# Patient Record
Sex: Male | Born: 1990 | Race: White | Hispanic: No | Marital: Married | Smoking: Never smoker
Health system: Southern US, Community
[De-identification: ages and names within clinical notes are randomized; demographics above are authoritative.]

## PROBLEM LIST (undated history)

## (undated) DIAGNOSIS — G43909 Migraine, unspecified, not intractable, without status migrainosus: Secondary | ICD-10-CM

## (undated) DIAGNOSIS — F419 Anxiety disorder, unspecified: Secondary | ICD-10-CM

## (undated) HISTORY — PX: WISDOM TOOTH EXTRACTION: SHX21

## (undated) HISTORY — DX: Anxiety disorder, unspecified: F41.9

## (undated) HISTORY — DX: Migraine, unspecified, not intractable, without status migrainosus: G43.909

---
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
## 2008-03-06 ENCOUNTER — Emergency Department (HOSPITAL_COMMUNITY): Admission: EM | Admit: 2008-03-06 | Discharge: 2008-03-06 | Payer: Self-pay | Admitting: Emergency Medicine

## 2008-09-26 ENCOUNTER — Emergency Department (HOSPITAL_COMMUNITY): Admission: EM | Admit: 2008-09-26 | Discharge: 2008-09-26 | Payer: Self-pay | Admitting: Emergency Medicine

## 2011-09-23 LAB — STREP A DNA PROBE: Group A Strep Probe: NEGATIVE

## 2011-09-23 LAB — BASIC METABOLIC PANEL
BUN: 17
CO2: 30
Calcium: 9.3
Chloride: 103
Creatinine, Ser: 1.08
GFR calc Af Amer: >60
GFR calc non Af Amer: >60
Glucose, Bld: 112 — ABNORMAL HIGH
Potassium: 4.2
Sodium: 138

## 2011-09-23 LAB — CBC
HCT: 47.6
Hemoglobin: 16.6
MCHC: 34.9
MCV: 89.3
Platelets: 208
RBC: 5.33
RDW: 13.6
WBC: 7.1

## 2011-09-23 LAB — DIFFERENTIAL
Basophils Absolute: 0
Eosinophils Relative: 1
Lymphocytes Relative: 12
Monocytes Absolute: 0.8

## 2011-09-23 LAB — RAPID STREP SCREEN (MED CTR MEBANE ONLY): Streptococcus, Group A Screen (Direct): NEGATIVE

## 2016-01-13 ENCOUNTER — Emergency Department (HOSPITAL_COMMUNITY)
Admission: EM | Admit: 2016-01-13 | Discharge: 2016-01-14 | Disposition: A | Discharge status: Home / Self Care | Payer: Self-pay | Attending: Emergency Medicine | Admitting: Emergency Medicine

## 2016-01-13 ENCOUNTER — Encounter (HOSPITAL_COMMUNITY): Payer: Self-pay | Admitting: *Deleted

## 2016-01-13 DIAGNOSIS — S61212A Laceration without foreign body of right middle finger without damage to nail, initial encounter: Secondary | ICD-10-CM | POA: Insufficient documentation

## 2016-01-13 DIAGNOSIS — J069 Acute upper respiratory infection, unspecified: Secondary | ICD-10-CM | POA: Insufficient documentation

## 2016-01-13 DIAGNOSIS — Y998 Other external cause status: Secondary | ICD-10-CM | POA: Insufficient documentation

## 2016-01-13 DIAGNOSIS — Y9389 Activity, other specified: Secondary | ICD-10-CM | POA: Insufficient documentation

## 2016-01-13 DIAGNOSIS — Y9289 Other specified places as the place of occurrence of the external cause: Secondary | ICD-10-CM | POA: Insufficient documentation

## 2016-01-13 DIAGNOSIS — S61219A Laceration without foreign body of unspecified finger without damage to nail, initial encounter: Secondary | ICD-10-CM

## 2016-01-13 DIAGNOSIS — Z880 Allergy status to penicillin: Secondary | ICD-10-CM | POA: Insufficient documentation

## 2016-01-13 DIAGNOSIS — W268XXA Contact with other sharp object(s), not elsewhere classified, initial encounter: Secondary | ICD-10-CM | POA: Insufficient documentation

## 2016-01-13 DIAGNOSIS — J40 Bronchitis, not specified as acute or chronic: Secondary | ICD-10-CM | POA: Insufficient documentation

## 2016-01-13 MED ORDER — HYDROCOD POLST-CPM POLST ER 10-8 MG/5ML PO SUER
5.0000 mL | Freq: Once | ORAL | Status: AC
  Administered 2016-01-14: 5 mL via ORAL
  Filled 2016-01-13: qty 5

## 2016-01-13 MED ORDER — LIDOCAINE HCL (PF) 1 % IJ SOLN
5.0000 mL | Freq: Once | INTRAMUSCULAR | Status: AC
  Administered 2016-01-14: 5 mL
  Filled 2016-01-13: qty 5

## 2016-01-13 NOTE — ED Notes (Addendum)
Pt c/o headache, congestion, itchy, watery eyes and sore throat. Pt states he felt dizzy earlier today while he was changing the blade on a scraper and it cut the top of his right middle finger. Pt states the laceration has been bleeding ever since.

## 2016-01-13 NOTE — ED Provider Notes (Signed)
CSN: [id number]     Arrival date & time 01/13/16  2251 History   First MD Initiated Contact with Patient 01/13/16 2323     Chief Complaint  Patient presents with  . finger laceration    . Headache     (Consider location/radiation/quality/duration/timing/severity/associated sxs/prior Treatment) HPI Comments: Pt reports cutting the right middle finger with a scraper blade. Last tetanus was 2 years ago.  Patient is a 25 y.o. male presenting with URI. The history is provided by the patient.  URI Presenting symptoms: congestion, cough, rhinorrhea and sore throat   Presenting symptoms: no fever   Presenting symptoms comment:  Sneezing Severity:  Moderate Onset quality:  Gradual Duration:  1 week Timing:  Intermittent Progression:  Worsening Chronicity:  New Relieved by:  Nothing Associated symptoms: headaches   Associated symptoms: no myalgias and no wheezing   Risk factors: sick contacts   Risk factors: no diabetes mellitus, no immunosuppression and no recent travel     History reviewed. No pertinent past medical history. History reviewed. No pertinent past surgical history. History reviewed. No pertinent family history. Social History  Substance Use Topics  . Smoking status: Never Smoker   . Smokeless tobacco: None  . Alcohol Use: No    Review of Systems  Constitutional: Negative for fever.  HENT: Positive for congestion, rhinorrhea and sore throat.   Respiratory: Positive for cough. Negative for wheezing.   Musculoskeletal: Negative for myalgias.  Neurological: Positive for headaches.  All other systems reviewed and are negative.     Allergies  Penicillins  Home Medications   Prior to Admission medications   Not on File   BP 131/73 mmHg  Pulse 70  Temp(Src) 98.6 F (37 C) (Oral)  Resp 16  Ht 6\' 1"  (1.854 m)  Wt 81.647 kg  BMI 23.75 kg/m2  SpO2 98% Physical Exam  Constitutional: He is oriented to person, place, and time. He appears well-developed and  well-nourished.  Non-toxic appearance.  HENT:  Head: Normocephalic.  Right Ear: Tympanic membrane and external ear normal.  Left Ear: Tympanic membrane and external ear normal.  Nasal congestion present.  Eyes: EOM and lids are normal. Pupils are equal, round, and reactive to light.  Neck: Normal range of motion. Neck supple. Carotid bruit is not present.  Cardiovascular: Normal rate, regular rhythm, normal heart sounds, intact distal pulses and normal pulses.   Pulmonary/Chest: Breath sounds normal. No respiratory distress.  course breath sounds. Symmetrical rise and fall of the chest.  Abdominal: Soft. Bowel sounds are normal. There is no tenderness. There is no guarding.  Musculoskeletal: Normal range of motion.  Lymphadenopathy:       Head (right side): No submandibular adenopathy present.       Head (left side): No submandibular adenopathy present.    He has no cervical adenopathy.  Neurological: He is alert and oriented to person, place, and time. He has normal strength. No cranial nerve deficit or sensory deficit.  Skin: Skin is warm and dry.  Psychiatric: He has a normal mood and affect. His speech is normal.  Nursing note and vitals reviewed.   ED Course  .Marland Kitchen.Laceration Repair Date/Time: 01/14/2016 1:21 AM Performed by: Ivery QualeBRYANT, Gagan Dillion Authorized by: Ivery QualeBRYANT, Dacian Orrico Consent: Verbal consent obtained. Risks and benefits: risks, benefits and alternatives were discussed Consent given by: patient Patient understanding: patient states understanding of the procedure being performed Patient identity confirmed: arm band Time out: Immediately prior to procedure a "time out" was called to verify the correct patient,  procedure, equipment, support staff and site/side marked as required. Body area: upper extremity Location details: right long finger Laceration length: 1.2 cm Foreign bodies: no foreign bodies Tendon involvement: none Anesthesia: local infiltration Local anesthetic:  lidocaine 1% without epinephrine Patient sedated: no Preparation: Patient was prepped and draped in the usual sterile fashion. Irrigation solution: saline Amount of cleaning: standard Skin closure: 4-0 Prolene Number of sutures: 3 Technique: simple Approximation: close Approximation difficulty: simple Dressing: gauze roll Patient tolerance: Patient tolerated the procedure well with no immediate complications   (including critical care time) Labs Review Labs Reviewed - No data to display  Imaging Review No results found. I have personally reviewed and evaluated these images and lab results as part of my medical decision-making.   EKG Interpretation None      MDM  Vital signs stable. Chest xray is negative for pneumonia. Exam favors URI and bronchitis. Pt has a laceration of the right middle finger. This was repaired with 3 sutures of 4-0 prolene. Pt to have sutures removed in 7 days. Rx for decadron and hycodan given to the patient. Pt to return sooner if any changes or problem.   Final diagnoses:  None    *I have reviewed nursing notes, vital signs, and all appropriate lab and imaging results for this patient.Ivery Quale, PA-C 01/14/16 1610  Devoria Albe, MD 01/14/16 [phone]

## 2016-01-14 ENCOUNTER — Emergency Department (HOSPITAL_COMMUNITY): Payer: Self-pay

## 2016-01-14 MED ORDER — DEXAMETHASONE 4 MG PO TABS: 4.0000 mg | ORAL_TABLET | Freq: Two times a day (BID) | ORAL | Status: AC

## 2016-01-14 MED ORDER — PROMETHAZINE HCL 12.5 MG PO TABS
12.5000 mg | ORAL_TABLET | Freq: Once | ORAL | Status: AC
  Administered 2016-01-14: 12.5 mg via ORAL
  Filled 2016-01-14: qty 1

## 2016-01-14 MED ORDER — HYDROCODONE-HOMATROPINE 5-1.5 MG/5ML PO SYRP: 5.0000 mL | ORAL_SOLUTION | Freq: Four times a day (QID) | ORAL | Status: AC | PRN

## 2016-01-14 MED ORDER — PREDNISONE 50 MG PO TABS
60.0000 mg | ORAL_TABLET | Freq: Once | ORAL | Status: AC
  Administered 2016-01-14: 60 mg via ORAL
  Filled 2016-01-14: qty 1

## 2016-01-14 NOTE — Discharge Instructions (Signed)
Your x-ray is negative for pneumonia. Your examination favors bronchitis and upper respiratory infection. Please increase fluids. Wash hands frequently. Usual mask until symptoms have resolved. Use Decadron 2 times daily with food. Use hycodan for cough. This medication may cause drowsiness, use with caution.  Your laceration was repaired with 3 sutures. Please have these removed in 7-10 days. Please see your primary physician, or return to the emergency department if any signs of infection. Upper Respiratory Infection, Adult Most upper respiratory infections (URIs) are caused by a virus. A URI affects the nose, throat, and upper air passages. The most common type of URI is often called "the common cold." HOME CARE   Take medicines only as told by your doctor.  Gargle warm saltwater or take cough drops to comfort your throat as told by your doctor.  Use a warm mist humidifier or inhale steam from a shower to increase air moisture. This may make it easier to breathe.  Drink enough fluid to keep your pee (urine) clear or pale yellow.  Eat soups and other clear broths.  Have a healthy diet.  Rest as needed.  Go back to work when your fever is gone or your doctor says it is okay.  You may need to stay home longer to avoid giving your URI to others.  You can also wear a face mask and wash your hands often to prevent spread of the virus.  Use your inhaler more if you have asthma.  Do not use any tobacco products, including cigarettes, chewing tobacco, or electronic cigarettes. If you need help quitting, ask your doctor. GET HELP IF:  You are getting worse, not better.  Your symptoms are not helped by medicine.  You have chills.  You are getting more short of breath.  You have brown or red mucus.  You have yellow or brown discharge from your nose.  You have pain in your face, especially when you bend forward.  You have a fever.  You have puffy (swollen) neck glands.  You have  pain while swallowing.  You have white areas in the back of your throat. GET HELP RIGHT AWAY IF:   You have very bad or constant:  Headache.  Ear pain.  Pain in your forehead, behind your eyes, and over your cheekbones (sinus pain).  Chest pain.  You have long-lasting (chronic) lung disease and any of the following:  Wheezing.  Long-lasting cough.  Coughing up blood.  A change in your usual mucus.  You have a stiff neck.  You have changes in your:  Vision.  Hearing.  Thinking.  Mood. MAKE SURE YOU:   Understand these instructions.  Will watch your condition.  Will get help right away if you are not doing well or get worse.   This information is not intended to replace advice given to you by your health care provider. Make sure you discuss any questions you have with your health care provider.   Document Released: 06/03/2008 Document Revised: 05/02/2015 Document Reviewed: 03/23/2014 Elsevier Interactive Patient Education 2016 [employer]  Stitches, [place]ples, or Adhesive Wound Closure Doctors use stitches (sutures), staples, and certain glue (skin adhesives) to hold your skin together while it heals (wound closure). You may need this treatment after you have surgery or if you cut your skin accidentally. These methods help your skin heal more quickly. They also make it less likely that you will have a scar. WHAT ARE THE DIFFERENT KINDS OF WOUND CLOSURES? There are many options for  wound closure. The one that your doctor uses depends on how deep and large your wound is. Adhesive Glue To use this glue to close a wound, your doctor holds the edges of the wound together and paints the glue on the surface of your skin. You may need more than one layer of glue. Then the wound may be covered with a light bandage (dressing). This type of skin closure may be used for small wounds that are not deep (superficial). Using glue for wound closure is less painful than other  methods. It does not require a medicine that numbs the area. This method also leaves nothing to be removed. Adhesive glue is often used for children and on facial wounds. Adhesive glue cannot be used for wounds that are deep, uneven, or bleeding. It is not used inside of a wound.  Adhesive Strips These strips are made of sticky (adhesive), porous paper. They are placed across your skin edges like a regular adhesive bandage. You leave them on until they fall off. Adhesive strips may be used to close very superficial wounds. They may also be used along with sutures to improve closure of your skin edges.  Sutures Sutures are the oldest method of wound closure. Sutures can be made from natural or synthetic materials. They can be made from a material that your body can break down as your wound heals (absorbable), or they can be made from a material that needs to be removed from your skin (nonabsorbable). They come in many different strengths and sizes. Your doctor attaches the sutures to a steel needle on one end. Sutures can be passed through your skin, or through the tissues beneath your skin. Then they are tied and cut. Your skin edges may be closed in one continuous stitch or in separate stitches. Sutures are strong and can be used for all kinds of wounds. Absorbable sutures may be used to close tissues under the skin. The disadvantage of sutures is that they may cause skin reactions that lead to infection. Nonabsorbable sutures need to be removed. Staples When surgical staples are used to close a wound, the edges of your skin on both sides of the wound are brought close together. A staple is placed across the wound, and an instrument secures the edges together. Staples are often used to close surgical cuts (incisions). Staples are faster to use than sutures, and they cause less reaction from your skin. Staples need to be removed using a tool that bends the staples away from your skin. HOW DO I CARE FOR  MY WOUND CLOSURE?  Take medicines only as told by your doctor.  If you were prescribed an antibiotic medicine for your wound, finish it all even if you start to feel better.  Use ointments or creams only as told by your doctor.  Wash your hands with soap and water before and after touching your wound.  Do not soak your wound in water. Do not take baths, swim, or use a hot tub until your doctor says it is okay.  Ask your doctor when you can start showering. Cover your wound if told by your doctor.  Do not take out your own sutures or staples.  Do not pick at your wound. Picking can cause an infection.  Keep all follow-up visits as told by your doctor. This is important. HOW LONG WILL I HAVE MY WOUND CLOSURE?   Leave adhesive glue on your skin until the glue peels away.  Leave adhesive strips on your  skin until they fall off.  Absorbable sutures will dissolve within several days.  Nonabsorbable sutures and staples must be removed. The location of the wound will determine how long they stay in. This can range from several days to a couple of weeks. WHEN SHOULD I SEEK HELP FOR MY WOUND CLOSURE? Contact your doctor if:  You have a fever.  You have chills.  You have redness, puffiness (swelling), or pain at the site of your wound.  You have fluid, blood, or pus coming from your wound.  There is a bad smell coming from your wound.  The skin edges of your wound start to separate after your sutures have been removed.  Your wound becomes thick, raised, and darker in color after your sutures come out (scarring).   This information is not intended to replace advice given to you by your health care provider. Make sure you discuss any questions you have with your health care provider.   Document Released: 10/13/2009 Document Revised: 01/06/2015 Document Reviewed: 05/25/2014 Elsevier Interactive Patient Education [employer]

## 2017-07-26 IMAGING — DX DG CHEST 2V
2 series · 2 of 2 positions shown · non-contrast
Comparison: None.

CLINICAL DATA: Acute onset of productive cough and congestion.
Headache, sore throat and itchy and watery eyes. Dizziness. Initial
encounter.

EXAM:
CHEST  2 VIEW

[chest pa]
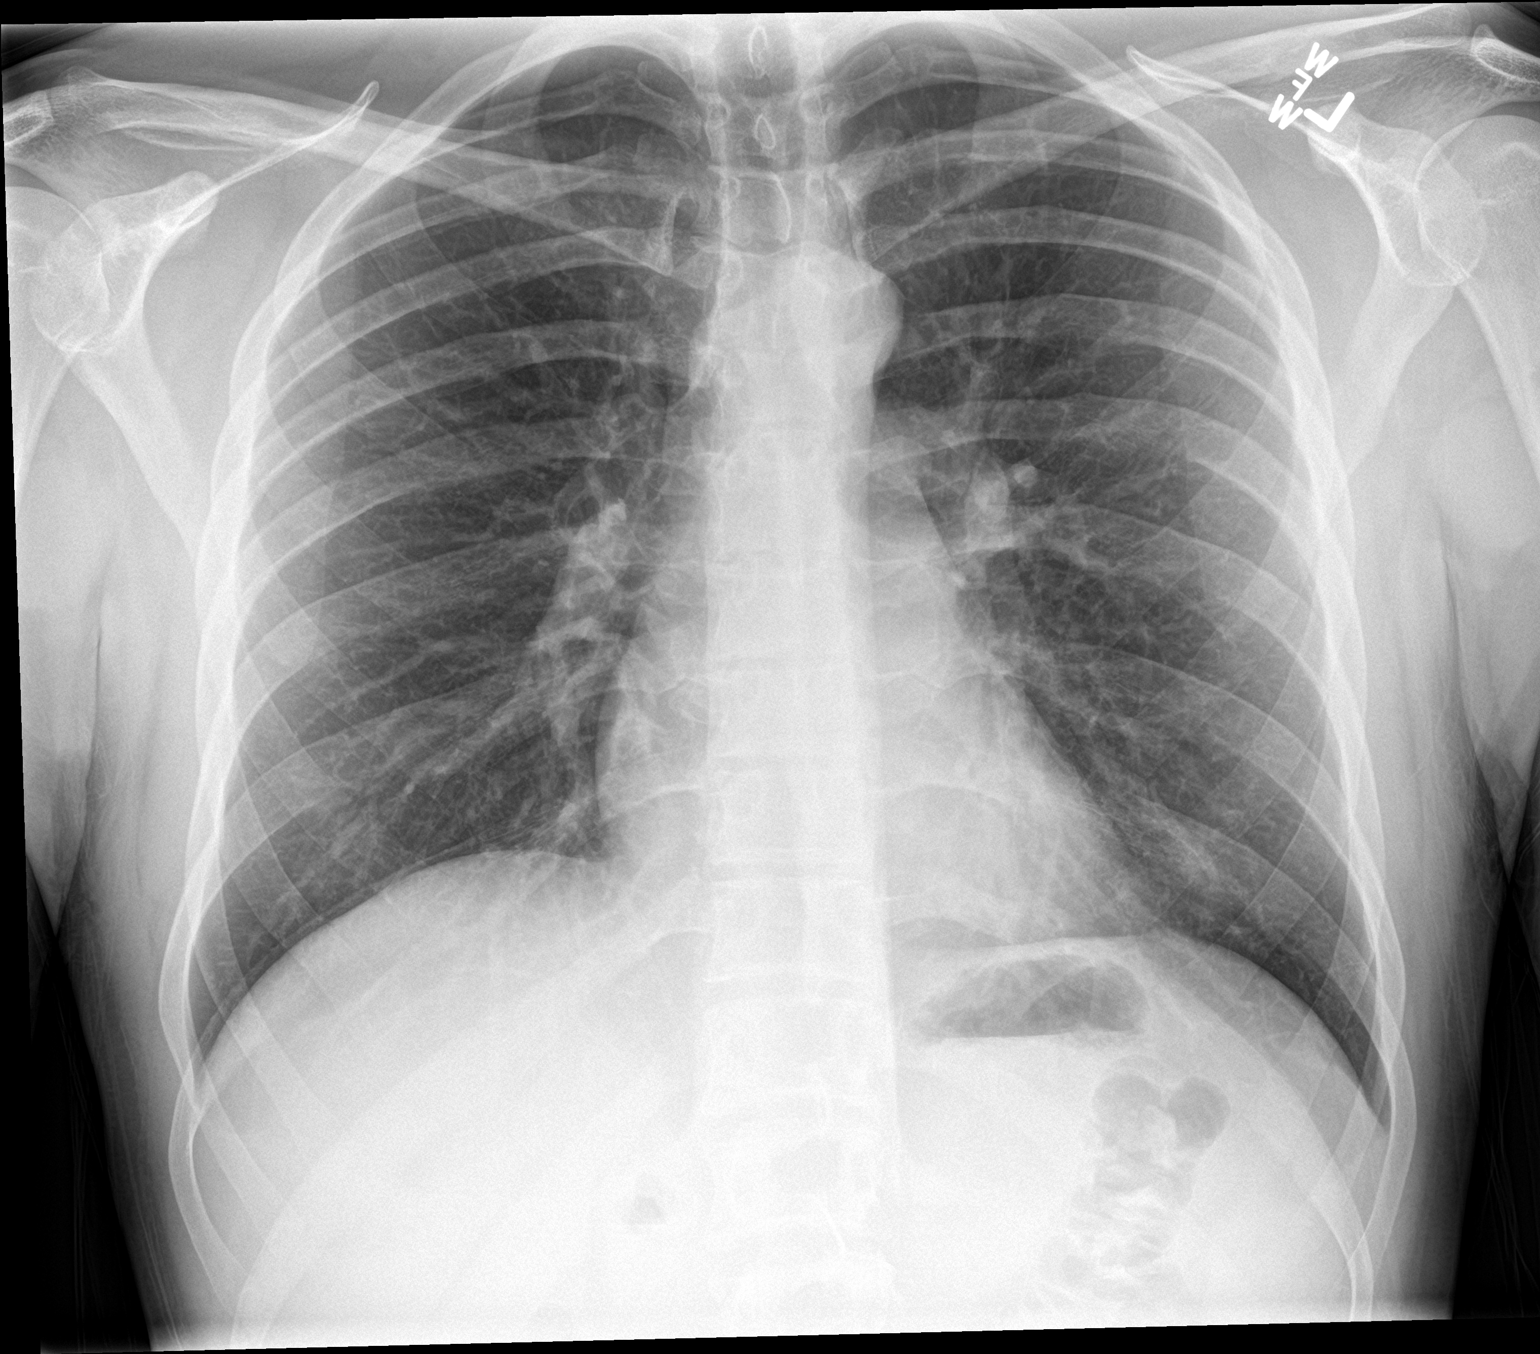

[chest lat]
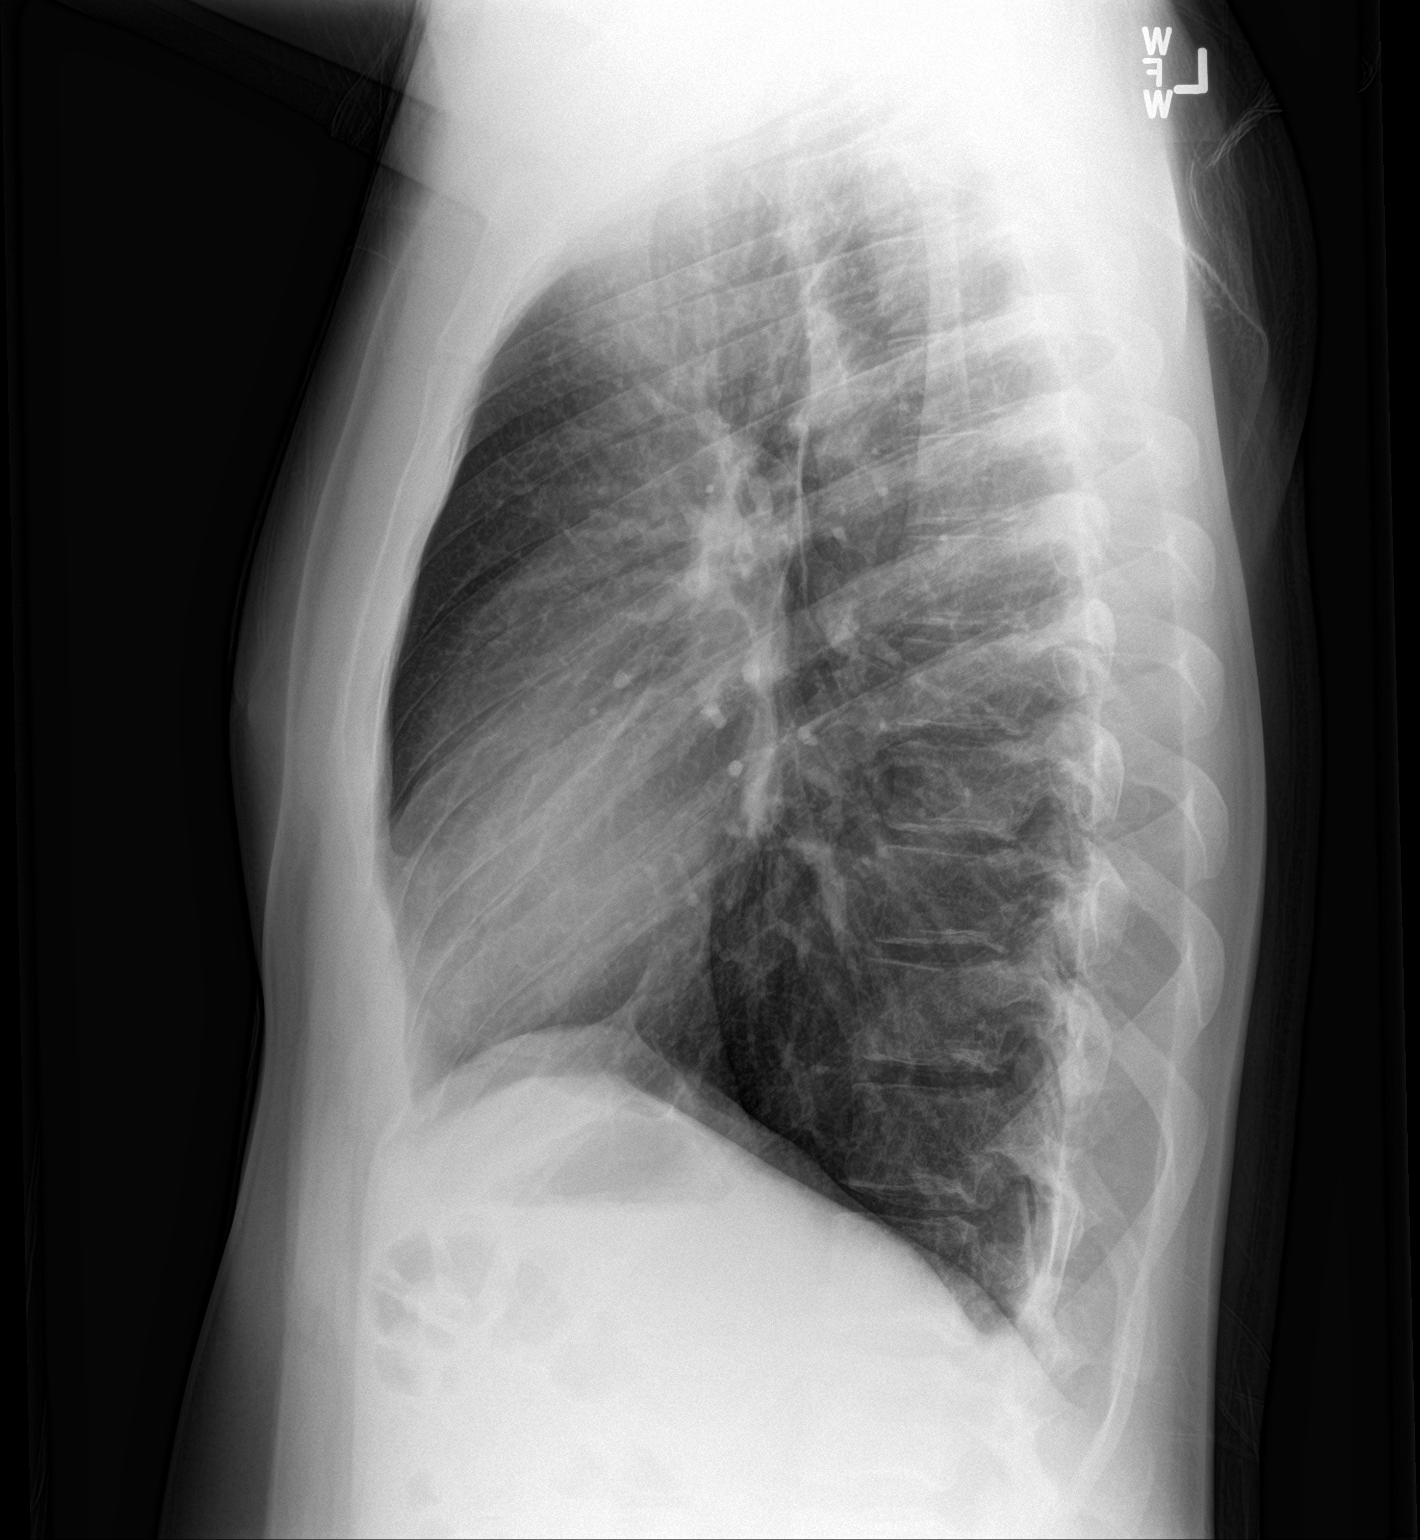

[2 of 2 positions shown; findings below may reference images not displayed]

FINDINGS: The lungs are well-aerated. Minimal left basilar atelectasis is
noted. There is no evidence of pleural effusion or pneumothorax.

The heart is normal in size; the mediastinal contour is within
normal limits. No acute osseous abnormalities are seen.
IMPRESSION: Minimal left basilar atelectasis noted.  Lungs otherwise clear.

## 2020-10-02 ENCOUNTER — Encounter: Payer: Self-pay | Admitting: Neurology

## 2021-01-01 ENCOUNTER — Ambulatory Visit (INDEPENDENT_AMBULATORY_CARE_PROVIDER_SITE_OTHER): Payer: Non-veteran care | Admitting: Neurology

## 2021-01-01 ENCOUNTER — Other Ambulatory Visit: Payer: Self-pay

## 2021-01-01 ENCOUNTER — Encounter: Payer: Self-pay | Admitting: Neurology

## 2021-01-01 VITALS — BP 119/72 | HR 77 | Ht 72.0 in | Wt 193.0 lb

## 2021-01-01 DIAGNOSIS — R202 Paresthesia of skin: Secondary | ICD-10-CM | POA: Diagnosis not present

## 2021-01-01 NOTE — Progress Notes (Signed)
[facility] Clinic Note - Initial Visit   Date: 01/01/21  Ronald Hoover MRN: [id number] DOB: 10-25-1991   Dear Dr. Lucianne Muss:  Thank you for your kind referral of Ronald Hoover for consultation of hand stiffness. Although his history is well known to you, please allow [place] to reiterate it for the purpose of our medical record. The patient was accompanied to the clinic by wife who also provides collateral information.     History of Present Illness: Ronald Hoover is a 30 y.o. right-handed male with anxiety/panic attacks and migraines presenting for evaluation of hand stiffness.  For the past two years, he reports having difficulty with relaxing his grip when holding on to objects.  Symptoms were worse when he was working in [employer] and he was working with tools and was unable to release his grip as easily.  It would take sometime for him to stretch his hands.  Since he stopped working, symptoms occur much less frequently.  He denies cramps, difficulty swallowing/talking. Sometimes, he had pain and numbness/tingling in the arms.  He also reports his right leg can fall asleep, if he's been sitting too long.   In 04-29-2019, he was sent for NCS and had syncopal event with nerve stimulation, so it was not completed.  Of note, bilateral medial and ulnar nerve responses were normal. The following day, he developed chest pain and was sent to the ER where he was told he had a panic attack.  In the fall of 04-28-2020, he stopped his paxil and a mood stabilizer, and he feels that his memory and overall health is doing better.  His hand symptoms are not as severe. He is referred for my opinion on whether symptoms could be myotonic dystrophy.  He has no family history of myotonic dystrophy.  He does have family history of hypertrophic cardiomyopathy and sudden death.  Patient had echo in 04/29/19 which was normal, EF 65-70%.   Past Medical History:  Diagnosis Date  . Anxiety   .  Migraine     History reviewed. No pertinent surgical history.   Medications:  Outpatient Encounter Medications as of 01/01/2021  Medication Sig  . Melatonin 3 MG CAPS Take 3 mg by mouth. Take one tablet at bedtime for sleep  . propranolol (INDERAL) 10 MG tablet Take 10 mg by mouth 3 (three) times daily. Take one tablet by mouth twice daily for anxiety  . dexamethasone (DECADRON) 4 MG tablet Take 1 tablet (4 mg total) by mouth 2 (two) times daily with a meal. (Patient not taking: Reported on 01/01/2021)  . HYDROcodone-homatropine (HYCODAN) 5-1.5 MG/5ML syrup Take 5 mLs by mouth every 6 (six) hours as needed. (Patient not taking: Reported on 01/01/2021)   No facility-administered encounter medications on file as of 01/01/2021.    Allergies:  Allergies  Allergen Reactions  . Penicillins    Social History: Social History   Tobacco Use  . Smoking status: Never Smoker  . Smokeless tobacco: Never Used  Vaping Use  . Vaping Use: Never used  Substance Use Topics  . Alcohol use: No  . Drug use: No   Social History   Social History Narrative   Right Handed   Lives in a one story home    Vital Signs:  BP 119/72   Pulse 77   Ht 6' (1.829 m)   Wt 193 lb (87.5 kg)   SpO2 96%   BMI 26.18 kg/m    General Medical Exam:  General:  Well appearing, comfortable.  No atypical facies. Eyes/ENT: see cranial nerve examination.   Neck:   No carotid bruits. Respiratory:  Clear to auscultation, good air entry bilaterally.   Cardiac:  Regular rate and rhythm, no murmur.   Extremities:  No deformities, edema, or skin discoloration.  Skin:  No rashes or lesions.  Neurological Exam: MENTAL STATUS including orientation to time, place, person, recent and remote memory, attention span and concentration, language, and fund of knowledge is normal.  Speech is not dysarthric.  CRANIAL NERVES: II:  No visual field defects.  Unremarkable fundi.   III-IV-VI: Pupils equal round and reactive to light.   Normal conjugate, extra-ocular eye movements in all directions of gaze.  No nystagmus.  No ptosis.   V:  Normal facial sensation.    VII:  Normal facial symmetry and movements.  Facial muscles are 5/5.  No eyelid myotonia VIII:  Normal hearing and vestibular function.   IX-X:  Normal palatal movement.   XI:  Normal shoulder shrug and head rotation.   XII:  Normal tongue strength and range of motion, no deviation or fasciculation.  MOTOR:  No atrophy, fasciculations or abnormal movements.  No pronator drift. No grip or percussion myotonia.  Upper Extremity:  Right  Left  Deltoid  5/5   5/5   Biceps  5/5   5/5   Triceps  5/5   5/5   Infraspinatus 5/5  5/5  Medial pectoralis 5/5  5/5  Wrist extensors  5/5   5/5   Wrist flexors  5/5   5/5   Finger extensors  5/5   5/5   Finger flexors  5/5   5/5   Dorsal interossei  5/5   5/5   Abductor pollicis  5/5   5/5   Tone (Ashworth scale)  0  0   Lower Extremity:  Right  Left  Hip flexors  5/5   5/5   Hip extensors  5/5   5/5   Adductor 5/5  5/5  Abductor 5/5  5/5  Knee flexors  5/5   5/5   Knee extensors  5/5   5/5   Dorsiflexors  5/5   5/5   Plantarflexors  5/5   5/5   Toe extensors  5/5   5/5   Toe flexors  5/5   5/5   Tone (Ashworth scale)  0  0   MSRs:  Right        Left                  brachioradialis 2+  2+  biceps 2+  2+  triceps 2+  2+  patellar 2+  2+  ankle jerk 2+  2+  Hoffman no  no  plantar response down  down   SENSORY:  Normal and symmetric perception of light touch, pinprick, vibration, and proprioception.  Romberg's sign absent.   COORDINATION/GAIT: Normal finger-to- nose-finger.  Intact rapid alternating movements bilaterally.  Able to rise from a chair without using arms.  Gait narrow based and stable. Tandem and stressed gait intact.    IMPRESSION: Bilateral arm dysesthesias and difficulty with releasing grip (subjective).  No signs of mytonia on exam.  In fact, his exam does not disclose any  neuromuscular abnormalities.  I discussed repeat NCS with EMG which would help determine if additional genetic testing is indicated.  My overall suspicion for myotonic dystrophy or other muscular dystrophy is very low. He expresses interest in repeat testing. I  will be cautious with testing given his prior syncopal event.   Total time spent reviewing records, interview, history/exam, documentation, and coordination of care on day of encounter:  45 min   Thank you for allowing me to participate in patient's care.  If I can answer any additional questions, I would be pleased to do so.    Sincerely,    Elfida Shimada K. Allena Katz, DO

## 2021-01-01 NOTE — Patient Instructions (Addendum)
Nerve testing of the arms ° °ELECTROMYOGRAM AND NERVE CONDUCTION STUDIES (EMG/NCS) INSTRUCTIONS ° °How to Prepare °The neurologist conducting the EMG will need to know if you have certain medical conditions. Tell the neurologist and other EMG lab personnel if you: °Have a pacemaker or any other electrical medical device °Take blood-thinning medications °Have hemophilia, a blood-clotting disorder that causes prolonged bleeding °Bathing °Take a shower or bath shortly before your exam in order to remove oils from your skin. Don’t apply lotions or creams before the exam.  °What to Expect °You’ll likely be asked to change into a hospital gown for the procedure and lie down on an examination table. The following explanations can help you understand what will happen during the exam.  °Electrodes. The neurologist or a technician places surface electrodes at various locations on your skin depending on where you’re experiencing symptoms. Or the neurologist may insert needle electrodes at different sites depending on your symptoms.  °Sensations. The electrodes will at times transmit a tiny electrical current that you may feel as a twinge or spasm. The needle electrode may cause discomfort or pain that usually ends shortly after the needle is removed. °If you are concerned about discomfort or pain, you may want to talk to the neurologist about taking a short break during the exam.  °Instructions. During the needle EMG, the neurologist will assess whether there is any spontaneous electrical activity when the muscle is at rest - activity that isn’t present in healthy muscle tissue - and the degree of activity when you slightly contract the muscle.  °He or she will give you instructions on resting and contracting a muscle at appropriate times. Depending on what muscles and nerves the neurologist is examining, he or she may ask you to change positions during the exam.  °After your EMG °You may experience some temporary, minor  bruising where the needle electrode was inserted into your muscle. This bruising should fade within several days. If it persists, contact your primary care doctor.  ° °

## 2021-02-07 ENCOUNTER — Ambulatory Visit (INDEPENDENT_AMBULATORY_CARE_PROVIDER_SITE_OTHER): Payer: Non-veteran care | Admitting: Neurology

## 2021-02-07 ENCOUNTER — Other Ambulatory Visit: Payer: Self-pay

## 2021-02-07 DIAGNOSIS — R202 Paresthesia of skin: Secondary | ICD-10-CM | POA: Diagnosis not present

## 2021-02-07 DIAGNOSIS — G5622 Lesion of ulnar nerve, left upper limb: Secondary | ICD-10-CM

## 2021-02-07 NOTE — Procedures (Signed)
[facility]  [address], [address]  [place], [place] [address] Tel: [phone] Fax:  [phone] Test Date:  02/07/2021  Patient: Ronald Hoover DOB: July 22, 1991 Physician: Nita Sickle, DO  Sex: Male Height: 6' " Ref Phys: Nita Sickle, DO  ID#: [id number]   Technician:    Patient Complaints: This is a 30 year old man referred for evaluation of bilateral hand stiffness and tingling.  NCV & EMG Findings: Extensive electrodiagnostic testing of the right upper extremity and additional studies of the left shows: 1. Bilateral median, ulnar, and mixed palmar sensory responses are within normal limits 2. Bilateral median and right ulnar motor responses are within normal limits.  Left ulnar motor response shows slowed conduction velocity across the elbow (A Elbow-B Elbow, 45 m/s).   3. There is no evidence of active or chronic motor axonal loss changes affecting any of the tested muscles.  Motor unit configuration and recruitment pattern is within normal limits.  Specifically, there is no evidence of abnormal insertional activity.  Impression: 1. Left ulnar neuropathy with slowing across the elbow, demyelinating, mild. 2. There is no evidence of cervical radiculopathy, carpal tunnel syndrome, or myopathy affecting the upper extremities.   ___________________________ Nita Sickle, DO    Nerve Conduction Studies Anti Sensory Summary Table   Stim Site NR Peak (ms) Norm Peak (ms) P-T Amp (V) Norm P-T Amp  Left Median Anti Sensory (2nd Digit)  33C  Wrist    3.1 <3.4 34.5 >20  Right Median Anti Sensory (2nd Digit)  33C  Wrist    2.9 <3.4 32.6 >20  Left Ulnar Anti Sensory (5th Digit)  33C  Wrist    2.8 <3.1 30.4 >12  Right Ulnar Anti Sensory (5th Digit)  33C  Wrist    2.5 <3.1 32.5 >12   Motor Summary Table   Stim Site NR Onset (ms) Norm Onset (ms) O-P Amp (mV) Norm O-P Amp Site1 Site2 Delta-0 (ms) Dist (cm) Vel (m/s) Norm Vel (m/s)  Left Median Motor (Abd Poll  Brev)  33C  Wrist    2.7 <3.9 9.3 >6 Elbow Wrist 5.1 30.0 59 >50  Elbow    7.8  9.2         Right Median Motor (Abd Poll Brev)  33C  Wrist    2.6 <3.9 9.5 >6 Elbow Wrist 5.1 31.0 61 >50  Elbow    7.7  9.4         Left Ulnar Motor (Abd Dig Minimi)  33C  Wrist    2.3 <3.1 8.2 >7 B Elbow Wrist 4.0 26.0 65 >50  B Elbow    6.3  7.8  A Elbow B Elbow 2.2 10.0 45 >50  A Elbow    8.5  7.5         Right Ulnar Motor (Abd Dig Minimi)  33C  Wrist    2.3 <3.1 8.7 >7 B Elbow Wrist 4.0 24.0 60 >50  B Elbow    6.3  8.3  A Elbow B Elbow 1.4 10.0 71 >50  A Elbow    7.7  8.2          Comparison Summary Table   Stim Site NR Peak (ms) Norm Peak (ms) P-T Amp (V) Site1 Site2 Delta-P (ms) Norm Delta (ms)  Left Median/Ulnar Palm Comparison (Wrist - 8cm)  33C  Median Palm    1.6 <2.2 58.4 Median Palm Ulnar Palm 0.0   Ulnar Palm    1.6 <2.2 17.2  Right Median/Ulnar Palm Comparison (Wrist - 8cm)  33C  Median Palm    1.5 <2.2 59.6 Median Palm Ulnar Palm 0.0   Ulnar Palm    1.5 <2.2 19.5       EMG   Side Muscle Ins Act Fibs Psw Fasc Number Recrt Dur Dur. Amp Amp. Poly Poly. Comment  Right 1stDorInt Nml Nml Nml Nml Nml Nml Nml Nml Nml Nml Nml Nml N/A  Right PronatorTeres Nml Nml Nml Nml Nml Nml Nml Nml Nml Nml Nml Nml N/A  Right Biceps Nml Nml Nml Nml Nml Nml Nml Nml Nml Nml Nml Nml N/A  Right Triceps Nml Nml Nml Nml Nml Nml Nml Nml Nml Nml Nml Nml N/A  Right Deltoid Nml Nml Nml Nml Nml Nml Nml Nml Nml Nml Nml Nml N/A  Left 1stDorInt Nml Nml Nml Nml Nml Nml Nml Nml Nml Nml Nml Nml N/A  Left PronatorTeres Nml Nml Nml Nml Nml Nml Nml Nml Nml Nml Nml Nml N/A  Left Biceps Nml Nml Nml Nml Nml Nml Nml Nml Nml Nml Nml Nml N/A  Left Triceps Nml Nml Nml Nml Nml Nml Nml Nml Nml Nml Nml Nml N/A  Left Deltoid Nml Nml Nml Nml Nml Nml Nml Nml Nml Nml Nml Nml N/A  Left FlexCarpiUln Nml Nml Nml Nml Nml Nml Nml Nml Nml Nml Nml Nml N/A      Waveforms:

## 2021-02-07 NOTE — Progress Notes (Signed)
    Follow-up Visit   Date: 02/07/21   MOYSES PAVEY MRN: [id number] DOB: 1991/02/23   Interim History: Ronald Hoover is a 30 y.o. right-handed Caucasian male returning to the clinic for follow-up of bilateral hand stiffness and tingling.  The patient was accompanied to the clinic by self.  History of present illness: For the past two years, he reports having difficulty with relaxing his grip when holding on to objects.  Symptoms were worse when he was working in [employer] and he was working with tools and was unable to release his grip as easily.  It would take sometime for him to stretch his hands.  Since he stopped working, symptoms occur much less frequently.  He denies cramps, difficulty swallowing/talking. Sometimes, he had pain and numbness/tingling in the arms.  He also reports his right leg can fall asleep, if he's been sitting too long.   In 2020, he was sent for NCS and had syncopal event with nerve stimulation, so it was not completed.  Of note, bilateral medial and ulnar nerve responses were normal. The following day, he developed chest pain and was sent to the ER where he was told he had a panic attack.  UPDATE 02/07/2021:  He is here for follow-up and EDX.  He continues to have tingling in the hands, which often wakes him up from sleeping. He admits to sleeping with his arms flexed.  The stiffness in the hands is not as noticeable.  No weakness.   Medications:  Current Outpatient Medications on File Prior to Visit  Medication Sig Dispense Refill  . dexamethasone (DECADRON) 4 MG tablet Take 1 tablet (4 mg total) by mouth 2 (two) times daily with a meal. (Patient not taking: Reported on 01/01/2021) 12 tablet 0  . HYDROcodone-homatropine (HYCODAN) 5-1.5 MG/5ML syrup Take 5 mLs by mouth every 6 (six) hours as needed. (Patient not taking: Reported on 01/01/2021) 120 mL 0  . Melatonin 3 MG CAPS Take 3 mg by mouth. Take one tablet at bedtime for sleep    . propranolol  (INDERAL) 10 MG tablet Take 10 mg by mouth 3 (three) times daily. Take one tablet by mouth twice daily for anxiety     No current facility-administered medications on file prior to visit.    Allergies:  Allergies  Allergen Reactions  . Penicillins     Neurological Exam: MENTAL STATUS including orientation to time, place, person, recent and remote memory, attention span and concentration, language, and fund of knowledge is normal.  Speech is not dysarthric.  MOTOR:  Motor strength is 5/5 in all extremities.  No atrophy, fasciculations or abnormal movements.  No pronator drift.  Tone is normal.    COORDINATION/GAIT:   Gait narrow based and stable.   Data: NCS/EMG of the left arm 02/07/2021: 1. Left ulnar neuropathy with slowing across the elbow, demyelinating, mild. 2. There is no evidence of cervical radiculopathy, carpal tunnel syndrome, or myopathy affecting the upper extremities.   IMPRESSION/PLAN: Left ulnar neuropathy at the elbow, mild.  NCS/EMG results discussed.   - Conservative management with avoidance of nerve compression/stretching discussed  Bilateral hand stiffness  - No evidence of neuromuscular condition  Return to clinic in as needed.     Thank you for allowing me to participate in patient's care.  If I can answer any additional questions, I would be pleased to do so.    Sincerely,    Donika K. Allena Katz, DO

## 2021-10-13 ENCOUNTER — Emergency Department (HOSPITAL_COMMUNITY)
Admission: EM | Admit: 2021-10-13 | Discharge: 2021-10-13 | Disposition: A | Discharge status: Home / Self Care | Payer: No Typology Code available for payment source | Attending: Emergency Medicine | Admitting: Emergency Medicine

## 2021-10-13 ENCOUNTER — Other Ambulatory Visit: Payer: Self-pay

## 2021-10-13 ENCOUNTER — Encounter (HOSPITAL_COMMUNITY): Payer: Self-pay | Admitting: Emergency Medicine

## 2021-10-13 DIAGNOSIS — W293XXA Contact with powered garden and outdoor hand tools and machinery, initial encounter: Secondary | ICD-10-CM | POA: Insufficient documentation

## 2021-10-13 DIAGNOSIS — S91311A Laceration without foreign body, right foot, initial encounter: Secondary | ICD-10-CM | POA: Diagnosis not present

## 2021-10-13 DIAGNOSIS — S99921A Unspecified injury of right foot, initial encounter: Secondary | ICD-10-CM | POA: Diagnosis present

## 2021-10-13 MED ORDER — LIDOCAINE HCL (PF) 1 % IJ SOLN
30.0000 mL | Freq: Once | INTRAMUSCULAR | Status: AC
  Administered 2021-10-13: 30 mL
  Filled 2021-10-13: qty 30

## 2021-10-13 NOTE — ED Triage Notes (Signed)
Pt cut into left foot with chainsaw. Bleeding controlled at this time.

## 2021-10-13 NOTE — Discharge Instructions (Addendum)
WOUND CARE Please have your stitches/staples removed in 10 days  or sooner if you have concerns. You may do this at any available urgent care or at your primary care doctor's office.  Keep area clean and dry for 24 hours. Do not remove bandage, if applied.  After 24 hours, remove bandage and wash wound gently with mild soap and warm water. Reapply a new bandage after cleaning wound, if directed.  Continue daily cleansing with soap and water until stitches/staples are removed.  Do not apply any ointments or creams to the wound while stitches/staples are in place, as this may cause delayed healing.  Seek medical careif you experience any of the following signs of infection: Swelling, redness, pus drainage, streaking, fever >101.0 F  Seek care if you experience excessive bleeding that does not stop after 15-20 minutes of constant, firm pressure.   

## 2021-10-13 NOTE — ED Provider Notes (Signed)
[facility] EMERGENCY DEPARTMENT Provider Note   CSN: [id number] Arrival date & time: 10/13/21  1827     History Chief Complaint  Patient presents with   Laceration    Ronald Hoover is a 30 y.o. male.  The history is provided by the patient. No language interpreter was used.  Laceration Location:  Foot Foot laceration location:  Top of L foot Length:  2cm, 4 cm Depth:  Cutaneous Quality: jagged and straight   Bleeding: venous   Time since incident:  2 hours Injury mechanism: chainsaw. Pain details:    Quality:  Aching   Severity:  Moderate   Timing:  Constant   Progression:  Improving Foreign body present: pieces of shoe. Relieved by:  Nothing Worsened by:  Pressure Ineffective treatments:  None tried Tetanus status:  Up to date Associated symptoms: no fever, no focal weakness, no numbness, no rash, no redness, no swelling and no streaking       Past Medical History:  Diagnosis Date   Anxiety    Migraine     There are no problems to display for this patient.   History reviewed. No pertinent surgical history.     History reviewed. No pertinent family history.  Social History   Tobacco Use   Smoking status: Never   Smokeless tobacco: Never  Vaping Use   Vaping Use: Never used  Substance Use Topics   Alcohol use: No   Drug use: No    Home Medications Prior to Admission medications   Medication Sig Start Date End Date Taking? Authorizing Provider  dexamethasone (DECADRON) 4 MG tablet Take 1 tablet (4 mg total) by mouth 2 (two) times daily with a meal. Patient not taking: Reported on 01/01/2021 01/14/16   Ivery Quale, PA-C  HYDROcodone-homatropine [facility]) 5-1.5 MG/5ML syrup Take 5 mLs by mouth every 6 (six) hours as needed. Patient not taking: Reported on 01/01/2021 01/14/16   Ivery Quale, PA-C  Melatonin 3 MG CAPS Take 3 mg by mouth. Take one tablet at bedtime for sleep    [provider]  propranolol (INDERAL) 10 MG tablet Take  10 mg by mouth 3 (three) times daily. Take one tablet by mouth twice daily for anxiety    [provider]    Allergies    Penicillins  Review of Systems   Review of Systems  Constitutional:  Negative for fever.  Skin:  Positive for wound. Negative for rash.  Neurological:  Negative for dizziness, focal weakness, weakness and numbness.   Physical Exam Updated Vital Signs BP (!) 143/90 (BP Location: Right Arm)   Pulse 69   Temp 98 F (36.7 C) (Oral)   Resp 17   Ht 6' (1.829 m)   Wt 87.5 kg   SpO2 100%   BMI 26.16 kg/m   Physical Exam Vitals and nursing note reviewed.  Constitutional:      General: He is not in acute distress.    Appearance: He is well-developed. He is not diaphoretic.  HENT:     Head: Normocephalic and atraumatic.  Eyes:     General: No scleral icterus.    Conjunctiva/sclera: Conjunctivae normal.  Cardiovascular:     Rate and Rhythm: Normal rate and regular rhythm.     Heart sounds: Normal heart sounds.  Pulmonary:     Effort: Pulmonary effort is normal. No respiratory distress.     Breath sounds: Normal breath sounds.  Abdominal:     Palpations: Abdomen is soft.  Tenderness: There is no abdominal tenderness.  Musculoskeletal:     Cervical back: Normal range of motion and neck supple.     Comments: Normal strength and ROM of the toes of the left foot and ankle. Cap refill <2 seconds. NVI  Skin:    General: Skin is warm and dry.     Findings: Laceration present.     Comments: 2 lacerations on the medial L dorsum of the foot  Neurological:     Mental Status: He is alert.  Psychiatric:        Behavior: Behavior normal.    ED Results / Procedures / Treatments   Labs (all labs ordered are listed, but only abnormal results are displayed) Labs Reviewed - No data to display  EKG None  Radiology No results found.  Procedures .Marland KitchenLaceration Repair  Date/Time: 10/13/2021 9:27 PM Performed by: Arthor Captain, PA-C Authorized by:  Arthor Captain, PA-C   Consent:    Consent obtained:  Verbal   Consent given by:  Patient   Risks discussed:  Infection, need for additional repair, pain, poor cosmetic result and poor wound healing   Alternatives discussed:  No treatment and delayed treatment Universal protocol:    Procedure explained and questions answered to patient or proxy's satisfaction: yes     Relevant documents present and verified: yes     Test results available: yes     Imaging studies available: yes     Required blood products, implants, devices, and special equipment available: yes     Site/side marked: yes     Immediately prior to procedure, a time out was called: yes     Patient identity confirmed:  Verbally with patient Anesthesia:    Anesthesia method:  Local infiltration   Local anesthetic:  Lidocaine 1% w/o epi Laceration details:    Location:  Foot   Foot location:  Top of L foot   Length (cm):  2 Pre-procedure details:    Preparation:  Patient was prepped and draped in usual sterile fashion Exploration:    Wound exploration: wound explored through full range of motion     Contaminated: yes   Treatment:    Area cleansed with:  Chlorhexidine   Amount of cleaning:  Extensive   Irrigation solution:  Sterile saline   Irrigation method:  Syringe   Visualized foreign bodies/material removed: yes (multiple pieces of  cloth and rubber)     Debridement:  Moderate Skin repair:    Repair method:  Sutures   Suture size:  5-0   Suture material:  Nylon   Suture technique:  Simple interrupted   Number of sutures:  3 Approximation:    Approximation:  Close Repair type:    Repair type:  Intermediate Post-procedure details:    Dressing:  Sterile dressing   Procedure completion:  Tolerated well, no immediate complications .Marland KitchenLaceration Repair  Date/Time: 10/13/2021 9:29 PM Performed by: Arthor Captain, PA-C Authorized by: Arthor Captain, PA-C   Consent:    Consent obtained:  Verbal   Consent  given by:  Patient   Risks discussed:  Infection, need for additional repair, pain, poor cosmetic result and poor wound healing   Alternatives discussed:  No treatment and delayed treatment Universal protocol:    Procedure explained and questions answered to patient or proxy's satisfaction: yes     Relevant documents present and verified: yes     Test results available: yes     Imaging studies available: yes     Required blood  products, implants, devices, and special equipment available: yes     Site/side marked: yes     Immediately prior to procedure, a time out was called: yes     Patient identity confirmed:  Verbally with patient Anesthesia:    Anesthesia method:  Local infiltration   Local anesthetic:  Lidocaine 1% w/o epi Laceration details:    Location:  Foot   Foot location:  Top of L foot   Length (cm):  4 Pre-procedure details:    Preparation:  Patient was prepped and draped in usual sterile fashion Exploration:    Wound exploration: wound explored through full range of motion and entire depth of wound visualized     Contaminated: yes   Treatment:    Area cleansed with:  Chlorhexidine   Amount of cleaning:  Extensive   Irrigation solution:  Sterile saline   Irrigation method:  Syringe   Visualized foreign bodies/material removed: yes (multiple pieces of embedded cloth and rubber)     Debridement:  Moderate Skin repair:    Repair method:  Sutures   Suture size:  5-0   Suture material:  Nylon   Suture technique:  Running locked (6) Approximation:    Approximation:  Close Repair type:    Repair type:  Intermediate Post-procedure details:    Dressing:  Sterile dressing   Procedure completion:  Tolerated well, no immediate complications   Medications Ordered in ED Medications  lidocaine (PF) (XYLOCAINE) 1 % injection 30 mL (30 mLs Infiltration Given by Other 10/13/21 2028)    ED Course  I have reviewed the triage vital signs and the nursing notes.  Pertinent  labs & imaging results that were available during my care of the patient were reviewed by me and considered in my medical decision making (see chart for details).    MDM Rules/Calculators/A&P                           Ronald Hoover is a 30 y.o. male who presents to ED for laceration of Left foot. Wound thoroughly cleaned in ED today. Wound explored and bottom of wound seen in a bloodless field. Laceration repaired as dictated above. Patient counseled on home wound care. Follow up with PCP/urgent care or return to ER for suture removal in 10 days. Patient was urged to return to the Emergency Department for worsening pain, swelling, expanding erythema especially if it streaks away from the affected area, fever, or for any additional concerns. Patient verbalized understanding. All questions answered.  Final Clinical Impression(s) / ED Diagnoses Final diagnoses:  Laceration of right foot, initial encounter  Contact with chainsaw as cause of accidental injury    Rx / DC Orders ED Discharge Orders     None        Arthor Captain, PA-C 10/13/21 2131    Pricilla Loveless, MD 10/13/21 2350

## 2024-06-28 ENCOUNTER — Encounter: Payer: Self-pay | Admitting: Internal Medicine

## 2024-08-02 NOTE — Progress Notes (Unsigned)
 GI Office Note    Referring Provider: Dasie Perkins, PA Primary Care Physician:  System, Provider Not In  Primary Gastroenterologist: [name].Rourk, MD  Chief Complaint   No chief complaint on file.  History of Present Illness   Ronald Hoover is a 33 y.o. male presenting today at the request of [place]randt, Perkins, [place] [facility] ***[facility] and colonoscopy given inability to perform at [place] until December.   Referral paperwork states patient has IBS with diarrhea which was not only mildly improved with daily fiber to reduce incidence of accidents but states BMs are beaded versus good formed stools and avoids Imodium due to causing severe constipation.  Having multiple bowel movements per day with lower abdominal pain prior to bowel movements which improves after defecation and with antianxiety medications.  Uses Bentyl as needed.  Bowel movements are often triggered by anxious episodes and involved in activities with lots of people.  Also reported worsening reflux now daily occurring every morning where he has a sour taste in his mouth and sometimes acid spit up.  Also admits to hiccups and belching as well but not using anything.  Notes some dysphagia at dinner but mostly occurring with heightened stress.  Denies any history of food impaction or regurgitation or aspiration.  Admits to NSAIDs multiple times per week for migraines.  He was advised to continue psyllium with adequate hydration, Imodium as needed.  Requested EGD and colonoscopy for further evaluation for peptic ulcer disease and chronic diarrhea to rule out colitis and IBD.  Also advised continue Bentyl 30 minutes prior to meals if needed  Today:  Discussed the use of AI scribe software for clinical note transcription with the patient, who gave verbal consent to proceed.  Wt Readings from Last 5 Encounters:  10/13/21 192 lb 14.4 oz (87.5 kg)  01/01/21 193 lb (87.5 kg)  01/13/16 180 lb (81.6 kg)    Current Outpatient  Medications  Medication Sig Dispense Refill   dexamethasone  (DECADRON ) 4 MG tablet Take 1 tablet (4 mg total) by mouth 2 (two) times daily with a meal. (Patient not taking: Reported on 01/01/2021) 12 tablet 0   HYDROcodone -homatropine (HYCODAN) 5-1.5 MG/5ML syrup Take 5 mLs by mouth every 6 (six) hours as needed. (Patient not taking: Reported on 01/01/2021) 120 mL 0   Melatonin 3 MG CAPS Take 3 mg by mouth. Take one tablet at bedtime for sleep     propranolol (INDERAL) 10 MG tablet Take 10 mg by mouth 3 (three) times daily. Take one tablet by mouth twice daily for anxiety     No current facility-administered medications for this visit.    Past Medical History:  Diagnosis Date   Anxiety    Migraine     No past surgical history on file.  No family history on file.  Allergies as of 08/03/2024 - Review Complete 10/13/2021  Allergen Reaction Noted   Penicillins  01/13/2016    Social History   Socioeconomic History   Marital status: Married    Spouse name: Not on file   Number of children: 4   Years of education: Not on file   Highest education level: Not on file  Occupational History   Not on file  Tobacco Use   Smoking status: Never   Smokeless tobacco: Never  Vaping Use   Vaping status: Never Used  Substance and Sexual Activity   Alcohol use: No   Drug use: No   Sexual activity: Not on file  Other  Topics Concern   Not on file  Social History Narrative   Right Handed   Lives in a one story home   Social Drivers of Health   Financial Resource Strain: Not on file  Food Insecurity: Not on file  Transportation Needs: Not on file  Physical Activity: Not on file  Stress: Not on file  Social Connections: Unknown (05/14/2022)   [facility]   Social Network    Social Network: Not on file  Intimate Partner Violence: Unknown (04/04/2022)   Received from [facility]   HITS    Physically Hurt: Not on file    Insult or Talk Down To: Not on file    Threaten  Physical Harm: Not on file    Scream or Curse: Not on file     Review of Systems   Gen: Denies any fever, chills, fatigue, weight loss, lack of appetite.  CV: Denies chest pain, heart palpitations, peripheral edema, syncope.  Resp: Denies shortness of breath at rest or with exertion. Denies wheezing or cough.  GI: see HPI GU : Denies urinary burning, urinary frequency, urinary hesitancy MS: Denies joint pain, muscle weakness, cramps, or limitation of movement.  Derm: Denies rash, itching, dry skin Psych: Denies depression, anxiety, memory loss, and confusion Heme: Denies bruising, bleeding, and enlarged lymph nodes.  Physical Exam   There were no vitals taken for this visit.  General:   Alert and oriented. Pleasant and cooperative. Well-nourished and well-developed.  Head:  Normocephalic and atraumatic. Eyes:  Without icterus, sclera clear and conjunctiva pink.  Ears:  Normal auditory acuity. Mouth:  No deformity or lesions, oral mucosa pink.  Lungs:  Clear to auscultation bilaterally. No wheezes, rales, or rhonchi. No distress.  Heart:  S1, S2 present without murmurs appreciated.  Abdomen:  +BS, soft, non-tender and non-distended. No HSM noted. No guarding or rebound. No masses appreciated.  Rectal:  deferred *** Msk:  Symmetrical without gross deformities. Normal posture. Extremities:  Without edema. Neurologic:  Alert and  oriented x4;  grossly normal neurologically. Skin:  Intact without significant lesions or rashes. Psych:  Alert and cooperative. Normal mood and affect.  Assessment   Ronald Hoover is a 33 y.o. male with a history of *** presenting today with ***  IBS-D  GERD, dysphagia, hiccups   PLAN   *** Avoid NSAIDs Continue Bentyl as needed Continue psyllium EGD and colonoscopy Imodium as needed Continue omeprazole 40 mg daily, consider increasing to twice daily. Follow up ***   Charmaine Melia, MSN, FNP-BC, AGACNP-BC [facility]

## 2024-08-02 NOTE — H&P (View-Only) (Signed)
 GI Office Note    Referring Provider: Dasie Perkins, [place] Primary Care Physician:  Dasie Perkins, PA  Primary Gastroenterologist: [name].Rourk, MD  Chief Complaint   Chief Complaint  Patient presents with   Dysphagia    Feels like food is stuck in his throat. Vomits sometimes. Has IBS.    History of Present Illness   Ronald Hoover is a 33 y.o. male presenting today at the request of Ank[place]randt, Perkins, [place] [facility] given inability to perform at [facility] until December.  Referral paperwork states patient has IBS with diarrhea which was not only mildly improved with daily fiber to reduce incidence of accidents but states BMs are beaded versus good formed stools and avoids Imodium due to causing severe constipation.  Having multiple bowel movements per day with lower abdominal pain prior to bowel movements which improves after defecation and with antianxiety medications.  Uses Bentyl as needed.  Bowel movements are often triggered by anxious episodes and involved in activities with lots of people.  Also reported worsening reflux now daily occurring every morning where he has a sour taste in his mouth and sometimes acid spit up.  Also admits to hiccups and belching as well but not using anything.  Notes some dysphagia at dinner but mostly occurring with heightened stress.  Denies any history of food impaction or regurgitation or aspiration.  Admits to NSAIDs multiple times per week for migraines.  He was advised to continue psyllium with adequate hydration, Imodium as needed.  Requested EGD and colonoscopy for further evaluation for peptic ulcer disease and chronic diarrhea to rule out colitis and IBD.  Also advised continue Bentyl 30 minutes prior to meals if needed  Today:  Discussed the use of AI scribe software for clinical note transcription with the patient, who gave verbal consent to proceed.  He has been experiencing difficulty swallowing, with a sensation  of something being stuck in his throat for several months. This occurs with both solids and liquids. He frequently experiences hiccups and burps after meals, and occasionally vomits, especially when rolling over in the morning or brushing his teeth. Vomiting episodes are often preceded by a feeling of nausea, and he sometimes swallows the vomit before reaching the bathroom. The vomitus is usually just water , as he keeps water  by his bed.  His symptoms occur with most foods and drinks, with a recent episode lasting three to four hours after eating a cheeseburger. His diet mainly consists of red meats, fattier meats, and fried foods. He consumes a significant amount of caffeine, including soda, tea, and coffee. He has not yet started omeprazole, which was recently mailed to him.  He has a history of irritable bowel syndrome (IBS) and experiences frequent diarrhea, with food passing quickly and in liquid form. He has tried medications like loperamide and dicyclomine, but they caused constipation when overused. Cramping occurs mostly while eating and in public places, which he attributes to stress and anxiety. He takes Wellbutrin, buspirone, and trazodone, the latter for sleep.  Denies any melena or BRBPR.  He has a history of frequent headaches and migraines, for which he takes propranolol as a preventative and sumatriptan for acute migraines. He also uses Tylenol, ibuprofen, or Aleve several times a week for headache relief. He previously took Tylenol more frequently before starting propranolol.  He recalls a past episode of salmonella, possibly contracted from a buffet, and mentions a history of exposure to unsanitary conditions during [employer], which he  believes may have contributed to his gastrointestinal issues.       Wt Readings from Last 5 Encounters:  08/03/24 230 lb 12.8 oz (104.7 kg)  10/13/21 192 lb 14.4 oz (87.5 kg)  01/01/21 193 lb (87.5 kg)  01/13/16 180 lb (81.6 kg)    Current  Outpatient Medications  Medication Sig Dispense Refill   buPROPion (WELLBUTRIN SR) 150 MG 12 hr tablet Take 150 mg by mouth 2 (two) times daily.     busPIRone (BUSPAR) 15 MG tablet Take 15 mg by mouth 3 (three) times daily.     dicyclomine (BENTYL) 20 MG tablet Take 20 mg by mouth every 6 (six) hours.     loperamide (IMODIUM) 2 MG capsule Take by mouth as needed for diarrhea or loose stools.     omeprazole (PRILOSEC) 20 MG capsule Take 40 mg by mouth daily.     propranolol (INDERAL) 10 MG tablet Take 10 mg by mouth 3 (three) times daily. Take one tablet by mouth twice daily for anxiety     psyllium (METAMUCIL) 58.6 % packet Take 1 packet by mouth daily.     traZODone (DESYREL) 50 MG tablet Take 50 mg by mouth at bedtime.     dexamethasone  (DECADRON ) 4 MG tablet Take 1 tablet (4 mg total) by mouth 2 (two) times daily with a meal. (Patient not taking: Reported on 08/03/2024) 12 tablet 0   HYDROcodone -homatropine (HYCODAN) 5-1.5 MG/5ML syrup Take 5 mLs by mouth every 6 (six) hours as needed. (Patient not taking: Reported on 08/03/2024) 120 mL 0   Melatonin 3 MG CAPS Take 3 mg by mouth. Take one tablet at bedtime for sleep (Patient not taking: Reported on 08/03/2024)     No current facility-administered medications for this visit.    Past Medical History:  Diagnosis Date   Anxiety    Migraine     History reviewed. No pertinent surgical history.  History reviewed. No pertinent family history.  Allergies as of 08/03/2024 - Review Complete 08/03/2024  Allergen Reaction Noted   Penicillins  01/13/2016    Social History   Socioeconomic History   Marital status: Married    Spouse name: Not on file   Number of children: 4   Years of education: Not on file   Highest education level: Not on file  Occupational History   Not on file  Tobacco Use   Smoking status: Never   Smokeless tobacco: Never  Vaping Use   Vaping status: Never Used  Substance and Sexual Activity   Alcohol use: No    Drug use: No   Sexual activity: Not on file  Other Topics Concern   Not on file  Social History Narrative   Right Handed   Lives in a one story home   Social Drivers of Health   Financial Resource Strain: Not on file  Food Insecurity: Not on file  Transportation Needs: Not on file  Physical Activity: Not on file  Stress: Not on file  Social Connections: Unknown (05/14/2022)   Received from [facility]   Social Network    Social Network: Not on file  Intimate Partner Violence: Unknown (04/04/2022)   Received from [facility]   HITS    Physically Hurt: Not on file    Insult or Talk Down To: Not on file    Threaten Physical Harm: Not on file    Scream or Curse: Not on file    Review of Systems   Gen: Denies any fever, chills,  fatigue, weight loss, lack of appetite.  CV: Denies chest pain, heart palpitations, peripheral edema, syncope.  Resp: Denies shortness of breath at rest or with exertion. Denies wheezing or cough.  GI: see HPI GU : Denies urinary burning, urinary frequency, urinary hesitancy MS: Denies joint pain, muscle weakness, cramps, or limitation of movement.  Derm: Denies rash, itching, dry skin Psych: + Anxiety/depression.  Denies memory loss, and confusion Heme: Denies bruising, bleeding, and enlarged lymph nodes.  Physical Exam   BP 120/83 (BP Location: Right Arm, Patient Position: Sitting, Cuff Size: Large)   Pulse 75   Temp 97.9 F (36.6 C) (Temporal)   Ht 6' (1.829 m)   Wt 230 lb 12.8 oz (104.7 kg)   BMI 31.30 kg/m   General:   Alert and oriented. Pleasant and cooperative. Well-nourished and well-developed.  Head:  Normocephalic and atraumatic. Eyes:  Without icterus, sclera clear and conjunctiva pink.  Ears:  Normal auditory acuity. Mouth:  No deformity or lesions, oral mucosa pink.  Lungs:  Clear to auscultation bilaterally. No wheezes, rales, or rhonchi. No distress.  Heart:  S1, S2 present without murmurs appreciated.  Abdomen:  +BS, soft,  non-tender and non-distended. No HSM noted. No guarding or rebound. No masses appreciated.  Rectal:  deferred Msk:  Symmetrical without gross deformities. Normal posture. Extremities:  Without edema. Neurologic:  Alert and  oriented x4;  grossly normal neurologically. Skin:  Intact without significant lesions or rashes. Psych:  Alert and cooperative. Normal mood and affect.  Assessment   Ronald Hoover is a 33 y.o. male with a history of IBS, anxiety/depression, chronic diarrhea, GERD presenting today to schedule EGD and colonoscopy for further evaluation given no sooner availability at the [facility].     Dysphagia and gastroesophageal reflux disease with globus sensation.  Chronic dysphagia with sensation of food impaction, hiccups, and regurgitation, suggestive of GERD with potential esophageal damage. Frequent NSAID use may exacerbate symptoms. Omeprazole prescribed but not initiated. Need to rule out H. pylori infection and assess esophageal damage. Chronic acid exposure can weaken the esophageal sphincter, leading to regurgitation and swallowing difficulties. - Start omeprazole 40 mg daily. - Schedule upper endoscopy to assess esophageal damage (gastritis, esophagitis, duodenitis, peptic ulcer disease) and test for H. pylori. - Educated on risks of NSAID use and its impact on GERD. - GERD diet recommended  Chronic diarrhea and evaluation for irritable bowel syndrome versus inflammatory bowel disease Chronic diarrhea with cramping, possibly stress-induced, suggestive of IBS. Differential includes IBD such as ulcerative colitis or Crohn's disease, celiac disease, and pancreatic insufficiency.  Reassuringly does not have any melena or BRBPR.  Previous extensive blood work but no specific GI labs available for review. Need for stool and blood tests to evaluate for inflammatory markers, celiac disease, and pancreatic insufficiency. - Ordering fecal calprotectin, celiac disease panel, CRP, TSH, and  pancreatic insufficiency. - Schedule colonoscopy with biopsy to evaluate for IBD and microscopic colitis. - Advise against taking dicyclomine or Imodium 3-4 days prior to colonoscopy. - Pending results of stool testing and colonoscopy may be a good candidate for Xifaxan. - Okay with continuing Bentyl and Imodium as needed up until colonoscopy.      Proceed with upper endoscopy with dilation and colonoscopy with propofol  by Dr. Shaaron in near future: the risks, benefits, and alternatives have been discussed with the patient in detail. The patient states understanding and desires to proceed. ASA 2  Hold Imodium and dicyclomine for 3-4 days prior  Hold trazodone night prior  to procedure.   PLAN   Follow up post procedure as needed - [facility]    Charmaine Melia, MSN, FNP-BC, AGACNP-BC [facility]

## 2024-08-03 ENCOUNTER — Telehealth: Payer: Self-pay | Admitting: *Deleted

## 2024-08-03 ENCOUNTER — Ambulatory Visit (INDEPENDENT_AMBULATORY_CARE_PROVIDER_SITE_OTHER): Admitting: Gastroenterology

## 2024-08-03 ENCOUNTER — Encounter: Payer: Self-pay | Admitting: Gastroenterology

## 2024-08-03 VITALS — BP 120/83 | HR 75 | Temp 97.9°F | Ht 72.0 in | Wt 230.8 lb

## 2024-08-03 DIAGNOSIS — K219 Gastro-esophageal reflux disease without esophagitis: Secondary | ICD-10-CM

## 2024-08-03 DIAGNOSIS — R1310 Dysphagia, unspecified: Secondary | ICD-10-CM

## 2024-08-03 DIAGNOSIS — R09A2 Foreign body sensation, throat: Secondary | ICD-10-CM

## 2024-08-03 DIAGNOSIS — K529 Noninfective gastroenteritis and colitis, unspecified: Secondary | ICD-10-CM | POA: Diagnosis not present

## 2024-08-03 NOTE — Telephone Encounter (Signed)
 LMOVM to call back to schedule TCS/EGD/ED with Dr. Shaaron, ASA 2, stop imdoim and dicyclomine 4 days prior and stop trazodone night prior

## 2024-08-03 NOTE — Patient Instructions (Addendum)
 VISIT SUMMARY: During your visit, we discussed your ongoing issues with difficulty swallowing, gastrointestinal symptoms, and chronic diarrhea. We reviewed your current medications and dietary habits, and we have outlined a plan to address your symptoms and investigate potential underlying causes.  YOUR PLAN: -DYSPHAGIA AND GASTROESOPHAGEAL REFLUX DISEASE (GERD): Dysphagia means difficulty swallowing, and GERD is a condition where stomach acid frequently flows back into the tube connecting your mouth and stomach, causing irritation. You should start taking omeprazole 40 mg daily to reduce stomach acid. We will schedule an upper endoscopy to check for any damage to your esophagus and test for H. pylori infection. It's important to be cautious with NSAID use, as it can worsen GERD symptoms.  Follow a GERD diet:  Avoid fried, fatty, greasy, spicy, citrus foods. Avoid caffeine and carbonated beverages. Avoid chocolate. Try eating 4-6 small meals a day rather than 3 large meals. Do not eat within 3 hours of laying down. Prop head of bed up on wood or bricks to create a 6 inch incline.  -CHRONIC DIARRHEA AND EVALUATION FOR IRRITABLE BOWEL SYNDROME (IBS) VERSUS INFLAMMATORY BOWEL DISEASE (IBD): Chronic diarrhea can be a symptom of IBS, which is a disorder affecting the large intestine, or IBD, which includes conditions like ulcerative colitis and Crohn's disease. We will conduct stool and blood tests to check for inflammatory markers, celiac disease, and pancreatic insufficiency. A colonoscopy with biopsy will be scheduled to further investigate the cause of your symptoms. Please avoid taking dicyclomine or Imodium 3-4 days before the colonoscopy.  INSTRUCTIONS: Please start taking omeprazole 40 mg daily as prescribed. We will schedule an upper endoscopy and a colonoscopy with biopsy. Avoid taking dicyclomine or Imodium two days before the colonoscopy.   Please have blood work completed at [facility].  We  will call you with results once they have been received. Please allow 3-5 business days for review. 2 locations for [facility] in [place]:              1. [address], [place]              2. [address] [place], [place]    Contains text generated by [employer].   It was a pleasure to see you today. I want to create trusting relationships with patients. If you receive a survey regarding your visit,  I greatly appreciate you taking time to fill this out on paper or through your MyChart. I value your feedback.  Charmaine Melia, MSN, FNP-BC, AGACNP-BC [facility]

## 2024-08-06 ENCOUNTER — Ambulatory Visit: Payer: Self-pay | Admitting: Gastroenterology

## 2024-08-06 LAB — PANCREATIC ELASTASE, FECAL: Pancreatic Elastase, Fecal: >800 ug Elast./g (ref >200)

## 2024-08-06 LAB — CALPROTECTIN, FECAL: Calprotectin, Fecal: <5 ug/g (ref 0–120)

## 2024-08-08 LAB — C-REACTIVE PROTEIN: CRP: 2 mg/L (ref 0–10)

## 2024-08-08 LAB — CELIAC DISEASE PANEL
Endomysial IgA: NEGATIVE
IgA/Immunoglobulin A, Serum: 229 mg/dL (ref 90–386)
Transglutaminase IgA: <2 U/mL (ref 0–3)

## 2024-08-08 LAB — TSH+FREE T4
Free T4: 1.16 ng/dL (ref 0.82–1.77)
TSH: 1.63 u[IU]/mL (ref 0.450–4.500)

## 2024-08-10 ENCOUNTER — Encounter: Payer: Self-pay | Admitting: *Deleted

## 2024-08-10 ENCOUNTER — Other Ambulatory Visit: Payer: Self-pay | Admitting: *Deleted

## 2024-08-10 MED ORDER — PEG 3350-KCL-NA BICARB-NACL 420 G PO SOLR: 4000.0000 mL | Freq: Once | ORAL | 0 refills | Status: AC

## 2024-08-10 NOTE — Telephone Encounter (Signed)
 Pt has been scheduled for 08/26/24. Instructions mailed and prep sent to pharmacy.

## 2024-08-26 ENCOUNTER — Ambulatory Visit (HOSPITAL_COMMUNITY)
Admission: RE | Admit: 2024-08-26 | Discharge: 2024-08-26 | Disposition: A | Discharge status: Home / Self Care | Attending: Internal Medicine | Admitting: Internal Medicine

## 2024-08-26 ENCOUNTER — Encounter (HOSPITAL_COMMUNITY)
Admission: RE | Disposition: A | Discharge status: Home / Self Care | Payer: Self-pay | Source: Home / Self Care | Attending: Internal Medicine

## 2024-08-26 ENCOUNTER — Ambulatory Visit (HOSPITAL_BASED_OUTPATIENT_CLINIC_OR_DEPARTMENT_OTHER): Admitting: Certified Registered"

## 2024-08-26 ENCOUNTER — Ambulatory Visit (HOSPITAL_COMMUNITY): Admitting: Certified Registered"

## 2024-08-26 ENCOUNTER — Encounter (HOSPITAL_COMMUNITY): Payer: Self-pay | Admitting: Internal Medicine

## 2024-08-26 ENCOUNTER — Other Ambulatory Visit: Payer: Self-pay

## 2024-08-26 DIAGNOSIS — K2970 Gastritis, unspecified, without bleeding: Secondary | ICD-10-CM | POA: Insufficient documentation

## 2024-08-26 DIAGNOSIS — Z7952 Long term (current) use of systemic steroids: Secondary | ICD-10-CM | POA: Diagnosis not present

## 2024-08-26 DIAGNOSIS — K5730 Diverticulosis of large intestine without perforation or abscess without bleeding: Secondary | ICD-10-CM

## 2024-08-26 DIAGNOSIS — K2100 Gastro-esophageal reflux disease with esophagitis, without bleeding: Secondary | ICD-10-CM | POA: Insufficient documentation

## 2024-08-26 DIAGNOSIS — K2289 Other specified disease of esophagus: Secondary | ICD-10-CM | POA: Insufficient documentation

## 2024-08-26 DIAGNOSIS — I10 Essential (primary) hypertension: Secondary | ICD-10-CM | POA: Insufficient documentation

## 2024-08-26 DIAGNOSIS — R1310 Dysphagia, unspecified: Secondary | ICD-10-CM | POA: Insufficient documentation

## 2024-08-26 DIAGNOSIS — Z79899 Other long term (current) drug therapy: Secondary | ICD-10-CM | POA: Insufficient documentation

## 2024-08-26 DIAGNOSIS — K529 Noninfective gastroenteritis and colitis, unspecified: Secondary | ICD-10-CM

## 2024-08-26 DIAGNOSIS — F419 Anxiety disorder, unspecified: Secondary | ICD-10-CM | POA: Insufficient documentation

## 2024-08-26 DIAGNOSIS — G43909 Migraine, unspecified, not intractable, without status migrainosus: Secondary | ICD-10-CM | POA: Diagnosis not present

## 2024-08-26 HISTORY — PX: COLONOSCOPY: SHX5424

## 2024-08-26 HISTORY — PX: ESOPHAGEAL DILATION: SHX303

## 2024-08-26 HISTORY — PX: ESOPHAGOGASTRODUODENOSCOPY: SHX5428

## 2024-08-26 SURGERY — COLONOSCOPY
Anesthesia: General

## 2024-08-26 MED ORDER — LIDOCAINE 2% (20 MG/ML) 5 ML SYRINGE
INTRAMUSCULAR | Status: DC | PRN
  Administered 2024-08-26: 100 mg via INTRAVENOUS

## 2024-08-26 MED ORDER — LACTATED RINGERS IV SOLN
INTRAVENOUS | Status: DC | PRN
Start: 2024-08-26 — End: 2024-08-26

## 2024-08-26 MED ORDER — LACTATED RINGERS IV SOLN: INTRAVENOUS | Status: DC

## 2024-08-26 MED ORDER — PROPOFOL 10 MG/ML IV BOLUS
INTRAVENOUS | Status: DC | PRN
  Administered 2024-08-26: 100 mg via INTRAVENOUS
  Administered 2024-08-26: 200 ug/kg/min via INTRAVENOUS

## 2024-08-26 MED ORDER — STERILE WATER FOR IRRIGATION IR SOLN
Status: DC | PRN
  Administered 2024-08-26 (×2): 60 mL

## 2024-08-26 NOTE — Discharge Instructions (Addendum)
 EGD Discharge instructions Please read the instructions outlined below and refer to this sheet in the next few weeks. These discharge instructions provide you with general information on caring for yourself after you leave the hospital. Your doctor may also give you specific instructions. While your treatment has been planned according to the most current medical practices available, unavoidable complications occasionally occur. If you have any problems or questions after discharge, please call your doctor. ACTIVITY You may resume your regular activity but move at a slower pace for the next 24 hours.  Take frequent rest periods for the next 24 hours.  Walking will help expel (get rid of) the air and reduce the bloated feeling in your abdomen.  No driving for 24 hours (because of the anesthesia (medicine) used during the test).  You may shower.  Do not sign any important legal documents or operate any machinery for 24 hours (because of the anesthesia used during the test).  NUTRITION Drink plenty of fluids.  You may resume your normal diet.  Begin with a light meal and progress to your normal diet.  Avoid alcoholic beverages for 24 hours or as instructed by your caregiver.  MEDICATIONS You may resume your normal medications unless your caregiver tells you otherwise.  WHAT YOU CAN EXPECT TODAY You may experience abdominal discomfort such as a feeling of fullness or "gas" pains.  FOLLOW-UP Your doctor will discuss the results of your test with you.  SEEK IMMEDIATE MEDICAL ATTENTION IF ANY OF THE FOLLOWING OCCUR: Excessive nausea (feeling sick to your stomach) and/or vomiting.  Severe abdominal pain and distention (swelling).  Trouble swallowing.  Temperature over 101 F (37.8 C).  Rectal bleeding or vomiting of blood.    Colonoscopy Discharge Instructions  Read the instructions outlined below and refer to this sheet in the next few weeks. These discharge instructions provide you with  general information on caring for yourself after you leave the hospital. Your doctor may also give you specific instructions. While your treatment has been planned according to the most current medical practices available, unavoidable complications occasionally occur. If you have any problems or questions after discharge, call Dr. Shaaron at [phone]. ACTIVITY You may resume your regular activity, but move at a slower pace for the next 24 hours.  Take frequent rest periods for the next 24 hours.  Walking will help get rid of the air and reduce the bloated feeling in your belly (abdomen).  No driving for 24 hours (because of the medicine (anesthesia) used during the test).   Do not sign any important legal documents or operate any machinery for 24 hours (because of the anesthesia used during the test).  NUTRITION Drink plenty of fluids.  You may resume your normal diet as instructed by your doctor.  Begin with a light meal and progress to your normal diet. Heavy or fried foods are harder to digest and may make you feel sick to your stomach (nauseated).  Avoid alcoholic beverages for 24 hours or as instructed.  MEDICATIONS You may resume your normal medications unless your doctor tells you otherwise.  WHAT YOU CAN EXPECT TODAY Some feelings of bloating in the abdomen.  Passage of more gas than usual.  Spotting of blood in your stool or on the toilet paper.  IF YOU HAD POLYPS REMOVED DURING THE COLONOSCOPY: No aspirin products for 7 days or as instructed.  No alcohol for 7 days or as instructed.  Eat a soft diet for the next 24 hours.  FINDING  OUT THE RESULTS OF YOUR TEST Not all test results are available during your visit. If your test results are not back during the visit, make an appointment with your caregiver to find out the results. Do not assume everything is normal if you have not heard from your caregiver or the medical facility. It is important for you to follow up on all of your test  results.  SEEK IMMEDIATE MEDICAL ATTENTION IF: You have more than a spotting of blood in your stool.  Your belly is swollen (abdominal distention).  You are nauseated or vomiting.  You have a temperature over 101.  You have abdominal pain or discomfort that is severe or gets worse throughout the day.      your esophagus was stretched today.  Biopsies were taken.    Continue taking omeprazole or Prilosec 40 mg daily as previously prescribed   your colon looked good.  Biopsies were taken   further recommendations to follow pending review of  biopsy report office   return office appointment with Ronald Hoover in 6 weeks

## 2024-08-26 NOTE — Op Note (Signed)
 [facility] Patient Name: Ronald Hoover Procedure Date: 08/26/2024 2:06 PM MRN: [id number] Date of Birth: September 28, 1991 Attending MD: Lamar Ozell Hollingshead , MD, [id number] CSN: [id number] Age: 33 Admit Type: Outpatient Procedure:                Colonoscopy Indications:              Chronic diarrhea Providers:                Lamar Ozell Hollingshead, MD, Madelin Hunter, RN, [place]                            Wilson, Technician Referring MD:              Medicines:                Propofol  per Anesthesia Complications:            No immediate complications. Estimated Blood Loss:     Estimated blood loss was minimal. Procedure:                Pre-Anesthesia Assessment:                           - Prior to the procedure, a History and Physical                            was performed, and patient medications and                            allergies were reviewed. The patient's tolerance of                            previous anesthesia was also reviewed. The risks                            and benefits of the procedure and the sedation                            options and risks were discussed with the patient.                            All questions were answered, and informed consent                            was obtained. Prior Anticoagulants: The patient has                            taken no anticoagulant or antiplatelet agents. ASA                            Grade Assessment: III - A patient with severe                            systemic disease. After reviewing the risks and  benefits, the patient was deemed in satisfactory                            condition to undergo the procedure.                           After obtaining informed consent, the colonoscope                            was passed under direct vision. Throughout the                            procedure, the patient's blood pressure, pulse, and                            oxygen  saturations were monitored continuously. The                            [id number]-[id number] ([id number]) Colon was introduced through                            the anus and advanced to the 15 cm into the ileum.                            The colonoscopy was performed without difficulty.                            The patient tolerated the procedure well. The                            quality of the bowel preparation was adequate. The                            entire colon was well visualized. The colonoscopy                            was performed without difficulty. The patient                            tolerated the procedure well. Scope In: 2:38:09 PM Scope Out: 2:47:35 PM Scope Withdrawal Time: 0 hours 6 minutes 57 seconds  Total Procedure Duration: 0 hours 9 minutes 26 seconds  Findings:      The perianal and digital rectal examinations were normal.      Scattered small-mouthed diverticula were found in the sigmoid colon and       descending colon.      The exam was otherwise without abnormality on direct and retroflexion       views. Distal 15 cm of terminal ileum appeared normal. Status post right       and left colon biopsies for histology Impression:               - Diverticulosis in the sigmoid colon and in the  descending colon.                           - The examination was otherwise normal on direct                            and retroflexion views.                           - Normal-appearing terminal ileum. Status post                            segmental biopsy. Moderate Sedation:      Moderate (conscious) sedation was personally administered by an       anesthesia professional. The following parameters were monitored: oxygen       saturation, heart rate, blood pressure, respiratory rate, EKG, adequacy       of pulmonary ventilation, and response to care. Recommendation:           - Patient has a contact number available for                             emergencies. The signs and symptoms of potential                            delayed complications were discussed with the                            patient. Return to normal activities tomorrow.                            Written discharge instructions were provided to the                            patient.                           - Resume previous diet.                           - Continue present medications.                           - Repeat colonoscopy at age 74 for screening                            purposes.                           - Return to GI office in 6 weeks. See EGD report. Procedure Code(s):        --- Professional ---                           [phone], Colonoscopy, flexible; diagnostic, including                            collection of specimen(s) by brushing  or washing,                            when performed (separate procedure) Diagnosis Code(s):        --- Professional ---                           K52.9, Noninfective gastroenteritis and colitis,                            unspecified                           K57.30, Diverticulosis of large intestine without                            perforation or abscess without bleeding CPT copyright 2022 American Medical Association. All rights reserved. The codes documented in this report are preliminary and upon coder review may  be revised to meet current compliance requirements. [name]. [name], MD Lamar Ozell Hollingshead, MD 08/26/2024 2:57:25 PM This report has been signed electronically. Number of Addenda: 0

## 2024-08-26 NOTE — Anesthesia Preprocedure Evaluation (Signed)
 Anesthesia Evaluation  Patient identified by MRN, date of birth, ID band Patient awake    Reviewed: Allergy & Precautions, H&P , NPO status , Patient's Chart, lab work & pertinent test results, reviewed documented beta blocker date and time   Airway Mallampati: II  TM Distance: >3 FB Neck ROM: full    Dental no notable dental hx.    Pulmonary neg pulmonary ROS   Pulmonary exam normal breath sounds clear to auscultation       Cardiovascular Exercise Tolerance: Good hypertension, negative cardio ROS  Rhythm:regular Rate:Normal     Neuro/Psych  Headaches  Anxiety     negative neurological ROS  negative psych ROS   GI/Hepatic negative GI ROS, Neg liver ROS,,,  Endo/Other  negative endocrine ROS    Renal/GU negative Renal ROS  negative genitourinary   Musculoskeletal   Abdominal   Peds  Hematology negative hematology ROS (+)   Anesthesia Other Findings   Reproductive/Obstetrics negative OB ROS                              Anesthesia Physical Anesthesia Plan  ASA: 2  Anesthesia Plan: General   Post-op Pain Management:    Induction:   PONV Risk Score and Plan: Propofol  infusion  Airway Management Planned:   Additional Equipment:   Intra-op Plan:   Post-operative Plan:   Informed Consent: I have reviewed the patients History and Physical, chart, labs and discussed the procedure including the risks, benefits and alternatives for the proposed anesthesia with the patient or authorized representative who has indicated his/her understanding and acceptance.     Dental Advisory Given  Plan Discussed with: CRNA  Anesthesia Plan Comments:         Anesthesia Quick Evaluation

## 2024-08-26 NOTE — Op Note (Signed)
 [facility] Patient Name: Ronald Hoover Procedure Date: 08/26/2024 2:12 PM MRN: [id number] Date of Birth: [birth year] Attending MD: Lamar Ozell Hollingshead , MD, [id number] CSN: [id number] Age: 33 Admit Type: Outpatient Procedure:                Upper GI endoscopy Indications:              Dysphagia Providers:                Lamar Ozell Hollingshead, MD, Madelin Hunter, RN, [place]                            Wilson, Technician Referring MD:              Medicines:                Propofol  per Anesthesia Complications:            No immediate complications. Estimated Blood Loss:     Estimated blood loss was minimal. Procedure:                Pre-Anesthesia Assessment:                           - Prior to the procedure, a History and Physical                            was performed, and patient medications and                            allergies were reviewed. The patient's tolerance of                            previous anesthesia was also reviewed. The risks                            and benefits of the procedure and the sedation                            options and risks were discussed with the patient.                            All questions were answered, and informed consent                            was obtained. Prior Anticoagulants: The patient has                            taken no anticoagulant or antiplatelet agents. ASA                            Grade Assessment: II - A patient with mild systemic                            disease. After reviewing the risks and benefits,  the patient was deemed in satisfactory condition to                            undergo the procedure.                           After obtaining informed consent, the endoscope was                            passed under direct vision. Throughout the                            procedure, the patient's blood pressure, pulse, and                            oxygen saturations  were monitored continuously. The                            [id number]-[id number] ([id number]) Upper was introduced through                            the mouth, and advanced to the second part of                            duodenum. The upper GI endoscopy was accomplished                            without difficulty. The patient tolerated the                            procedure well. Scope In: 2:24:18 PM Scope Out: 2:31:32 PM Total Procedure Duration: 0 hours 7 minutes 14 seconds  Findings:      The examined esophagus revealed a slightly ringed widely patent       esophagus normal-appearing GE junction except for a 5 mm grayish nodule       just above the GE junction. Query squamous papilloma but it was somewhat       prominent..      The entire examined stomach was normal.      The duodenal bulb and second portion of the duodenum were normal. The       scope was withdrawn. Dilation was performed with a Maloney dilator with       no resistance at 56 Fr. The dilation site was examined following       endoscope reinsertion and showed no change. Estimated blood loss: none.       Finally biopsy of the nodule was taken. Following nodule biopsy the mid       and distal esophagus were biopsied for histologic study. Impression:               - Distal esophageal nodule. Slightly ringed                            esophagus. Status post Agapito dilation followed by  biopsy                           - Normal stomach.                           - Normal duodenal bulb and second portion of the                            duodenum. Moderate Sedation:      Moderate (conscious) sedation was personally administered by an       anesthesia professional. The following parameters were monitored: oxygen       saturation, heart rate, blood pressure, respiratory rate, EKG, adequacy       of pulmonary ventilation, and response to care. Recommendation:           - Patient has a contact number  available for                            emergencies. The signs and symptoms of potential                            delayed complications were discussed with the                            patient. Return to normal activities tomorrow.                            Written discharge instructions were provided to the                            patient.                           - Advance diet as tolerated. Continue omeprazole or                            Prilosec 40 mg daily follow-up on pathology. See                            colonoscopy report. office visit with us  in 6 weeks Procedure Code(s):        --- Professional ---                           [phone], Esophagogastroduodenoscopy, flexible,                            transoral; diagnostic, including collection of                            specimen(s) by brushing or washing, when performed                            (separate procedure)                           43450,  Dilation of esophagus, by unguided sound or                            bougie, single or multiple passes Diagnosis Code(s):        --- Professional ---                           R13.10, Dysphagia, unspecified CPT copyright 2022 [employer]. All rights reserved. The codes documented in this report are preliminary and upon coder review may  be revised to meet current compliance requirements. [name]. Uriah Trueba, MD Lamar Ozell Hollingshead, MD 08/26/2024 2:53:47 PM This report has been signed electronically. Number of Addenda: 0

## 2024-08-26 NOTE — Transfer of Care (Signed)
 Immediate Anesthesia Transfer of Care Note  Patient: Ronald Hoover  Procedure(s) Performed: COLONOSCOPY EGD (ESOPHAGOGASTRODUODENOSCOPY) DILATION, ESOPHAGUS  Patient Location: Endoscopy Unit  Anesthesia Type:General  Level of Consciousness: drowsy  Airway & Oxygen Therapy: Patient Spontanous Breathing  Post-op Assessment: Report given to RN and Post -op Vital signs reviewed and stable  Post vital signs: Reviewed and stable  Last Vitals:  Vitals Value Taken Time  BP 92/61 08/26/24 14:52  Temp 36.6 C 08/26/24 14:52  Pulse 70 08/26/24 14:52  Resp 17 08/26/24 14:52  SpO2 93 % 08/26/24 14:52    Last Pain:  Vitals:   08/26/24 1452  TempSrc: Axillary  PainSc:       Patients Stated Pain Goal: 7 (08/26/24 1252)  Complications: No notable events documented.

## 2024-08-26 NOTE — Interval H&P Note (Signed)
 History and Physical Interval Note:  08/26/2024 2:12 PM  Ronald Hoover  has presented today for surgery, with the diagnosis of chronic diarrhea,dysphagia.  The various methods of treatment have been discussed with the patient and family. After consideration of risks, benefits and other options for treatment, the patient has consented to  Procedure(s) with comments: COLONOSCOPY (N/A) - 2:00 pm, asa 2 EGD (ESOPHAGOGASTRODUODENOSCOPY) (N/A) DILATION, ESOPHAGUS (N/A) as a surgical intervention.  The patient's history has been reviewed, patient examined, no change in status, stable for surgery.  I have reviewed the patient's chart and labs.  Questions were answered to the patient's satisfaction.     Krystan Northrop   no change.  EGD with esophageal dilation is feasible/appropriate today and diagnostic colonoscopy  The risks, benefits, limitations, imponderables and alternatives regarding both EGD and colonoscopy have been reviewed with the patient. Questions have been answered. All parties agreeable.

## 2024-08-27 ENCOUNTER — Encounter (HOSPITAL_COMMUNITY): Payer: Self-pay | Admitting: Internal Medicine

## 2024-08-28 NOTE — Anesthesia Postprocedure Evaluation (Signed)
 Anesthesia Post Note  Patient: Ronald Hoover  Procedure(s) Performed: COLONOSCOPY EGD (ESOPHAGOGASTRODUODENOSCOPY) DILATION, ESOPHAGUS  Patient location during evaluation: Phase II Anesthesia Type: General Level of consciousness: awake Pain management: pain level controlled Vital Signs Assessment: post-procedure vital signs reviewed and stable Respiratory status: spontaneous breathing and respiratory function stable Cardiovascular status: blood pressure returned to baseline and stable Postop Assessment: no headache and no apparent nausea or vomiting Anesthetic complications: no Comments: Late entry   No notable events documented.   Last Vitals:  Vitals:   08/26/24 1452 08/26/24 1456  BP: 92/61 103/69  Pulse: 70 74  Resp: 17 (!) 22  Temp: 36.6 C   SpO2: 93% 94%    Last Pain:  Vitals:   08/26/24 1456  TempSrc:   PainSc: 0-No pain                 Yvonna JINNY Bosworth

## 2024-08-31 LAB — SURGICAL PATHOLOGY

## 2024-09-07 ENCOUNTER — Ambulatory Visit: Payer: Self-pay | Admitting: Internal Medicine

## 2024-10-22 ENCOUNTER — Encounter: Payer: Self-pay | Admitting: Internal Medicine

## 2025-01-26 ENCOUNTER — Encounter: Payer: Self-pay | Admitting: Internal Medicine
